# Patient Record
Sex: Male | Born: 1995 | Race: White | Hispanic: No | Marital: Single | Smoking: Never smoker
Health system: Southern US, Community
[De-identification: ages and names within clinical notes are randomized; demographics above are authoritative.]

## PROBLEM LIST (undated history)

## (undated) DIAGNOSIS — F840 Autistic disorder: Secondary | ICD-10-CM

## (undated) DIAGNOSIS — G935 Compression of brain: Secondary | ICD-10-CM

## (undated) DIAGNOSIS — R635 Abnormal weight gain: Secondary | ICD-10-CM

## (undated) DIAGNOSIS — I10 Essential (primary) hypertension: Secondary | ICD-10-CM

## (undated) DIAGNOSIS — R569 Unspecified convulsions: Secondary | ICD-10-CM

## (undated) HISTORY — PX: TONSILLECTOMY: SUR1361

## (undated) HISTORY — DX: Abnormal weight gain: R63.5

---
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
## 2013-09-14 DIAGNOSIS — G40919 Epilepsy, unspecified, intractable, without status epilepticus: Secondary | ICD-10-CM | POA: Insufficient documentation

## 2014-11-28 DIAGNOSIS — R001 Bradycardia, unspecified: Secondary | ICD-10-CM | POA: Insufficient documentation

## 2016-07-01 ENCOUNTER — Encounter (HOSPITAL_COMMUNITY): Payer: Self-pay

## 2016-07-01 ENCOUNTER — Emergency Department (HOSPITAL_COMMUNITY): Payer: Medicaid Other

## 2016-07-01 ENCOUNTER — Emergency Department (HOSPITAL_COMMUNITY)
Admission: EM | Admit: 2016-07-01 | Discharge: 2016-07-01 | Disposition: A | Discharge status: Home / Self Care | Payer: Medicaid Other | Attending: Emergency Medicine | Admitting: Emergency Medicine

## 2016-07-01 DIAGNOSIS — F840 Autistic disorder: Secondary | ICD-10-CM | POA: Diagnosis not present

## 2016-07-01 DIAGNOSIS — Y999 Unspecified external cause status: Secondary | ICD-10-CM | POA: Insufficient documentation

## 2016-07-01 DIAGNOSIS — Y939 Activity, unspecified: Secondary | ICD-10-CM | POA: Diagnosis not present

## 2016-07-01 DIAGNOSIS — S1091XA Abrasion of unspecified part of neck, initial encounter: Secondary | ICD-10-CM | POA: Insufficient documentation

## 2016-07-01 DIAGNOSIS — M79605 Pain in left leg: Secondary | ICD-10-CM

## 2016-07-01 DIAGNOSIS — Y92410 Unspecified street and highway as the place of occurrence of the external cause: Secondary | ICD-10-CM | POA: Diagnosis not present

## 2016-07-01 DIAGNOSIS — M79652 Pain in left thigh: Secondary | ICD-10-CM | POA: Insufficient documentation

## 2016-07-01 DIAGNOSIS — S199XXA Unspecified injury of neck, initial encounter: Secondary | ICD-10-CM | POA: Diagnosis present

## 2016-07-01 HISTORY — DX: Unspecified convulsions: R56.9

## 2016-07-01 HISTORY — DX: Compression of brain: G93.5

## 2016-07-01 HISTORY — DX: Autistic disorder: F84.0

## 2016-07-01 NOTE — Discharge Instructions (Signed)
Please use ibuprofen or naproxen as needed for discomfort. Please place ice and anywhere that is sore. If patient develops any new or worsening signs or symptoms please return for immediate evaluation. Please follow-up with her primary care provider in 3 days for reevaluation.
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
## 2016-07-01 NOTE — ED Notes (Signed)
Jeff PA at bedside.  

## 2016-07-01 NOTE — ED Notes (Signed)
Patient transported to X-ray 
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
## 2016-07-01 NOTE — ED Notes (Signed)
Pts grandmother stated that they did not have a scheduled CT scan for the pt, she stated "we were just going to come here and get one done".
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
## 2016-09-08 ENCOUNTER — Other Ambulatory Visit: Payer: Self-pay | Admitting: Internal Medicine

## 2016-09-08 DIAGNOSIS — R945 Abnormal results of liver function studies: Principal | ICD-10-CM

## 2016-09-08 DIAGNOSIS — R7989 Other specified abnormal findings of blood chemistry: Secondary | ICD-10-CM

## 2016-09-16 ENCOUNTER — Ambulatory Visit
Admission: RE | Admit: 2016-09-16 | Discharge: 2016-09-16 | Disposition: A | Discharge status: Home / Self Care | Payer: Medicaid Other | Source: Ambulatory Visit | Attending: Internal Medicine | Admitting: Internal Medicine

## 2016-09-16 DIAGNOSIS — R945 Abnormal results of liver function studies: Principal | ICD-10-CM

## 2016-09-16 DIAGNOSIS — R7989 Other specified abnormal findings of blood chemistry: Secondary | ICD-10-CM

## 2017-03-10 IMAGING — US US ABDOMEN LIMITED
1 series · 14 of 25 positions shown · non-contrast
Comparison: 06/11/2016 abdominal CT

CLINICAL DATA: Elevated LFTs.

EXAM:
US ABDOMEN LIMITED - RIGHT UPPER QUADRANT

[Series 1: us abdomen limited · 0.33mm/px · 14 of 38 slices shown]
[im 1/38]
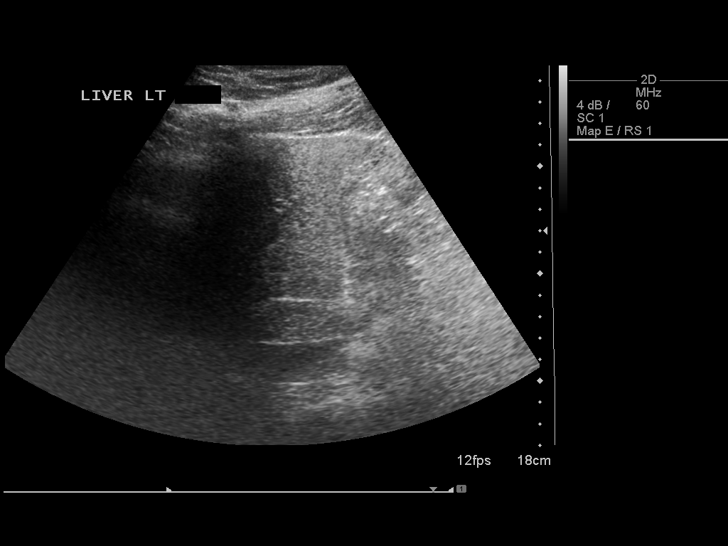
[im 4/38]
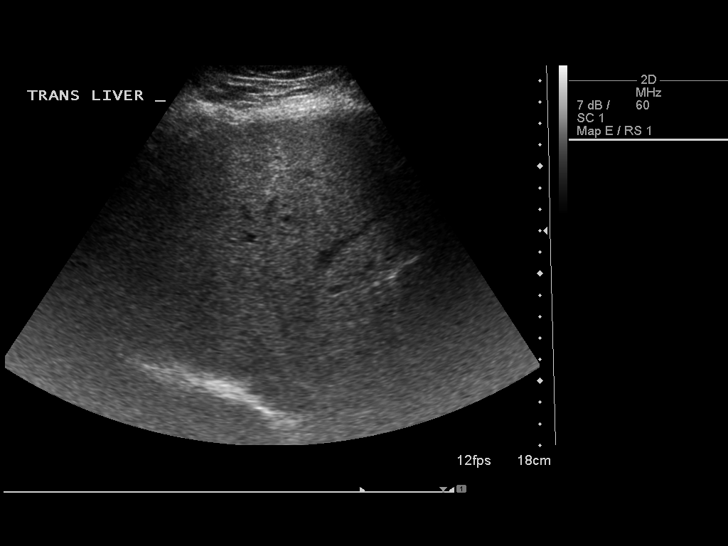
[im 7/38]
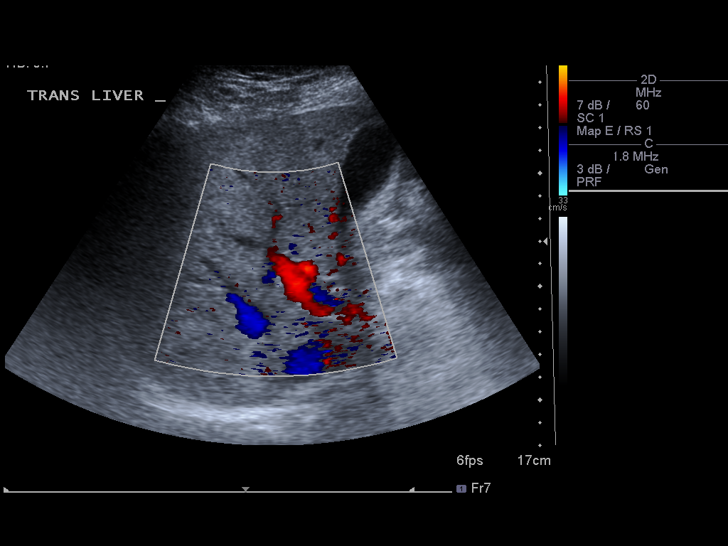
[im 10/38]
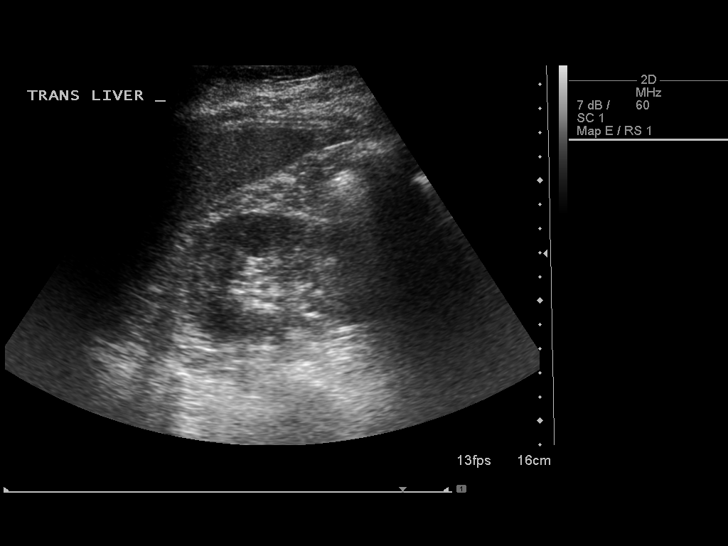
[im 13/38]
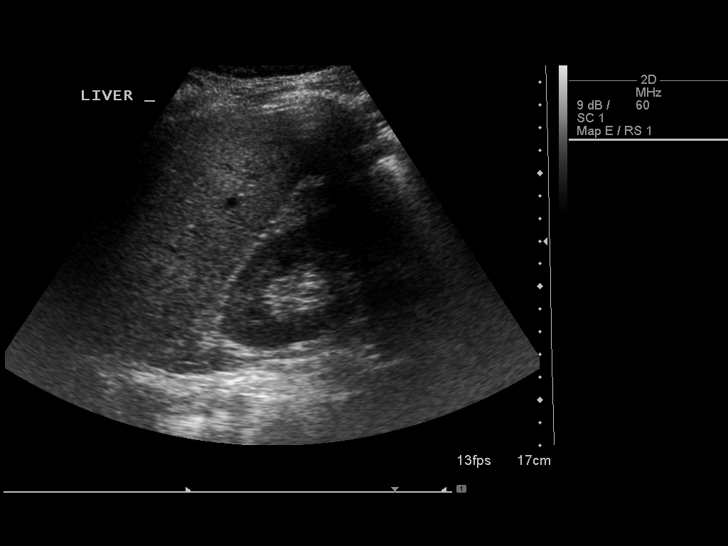
[im 14/38]
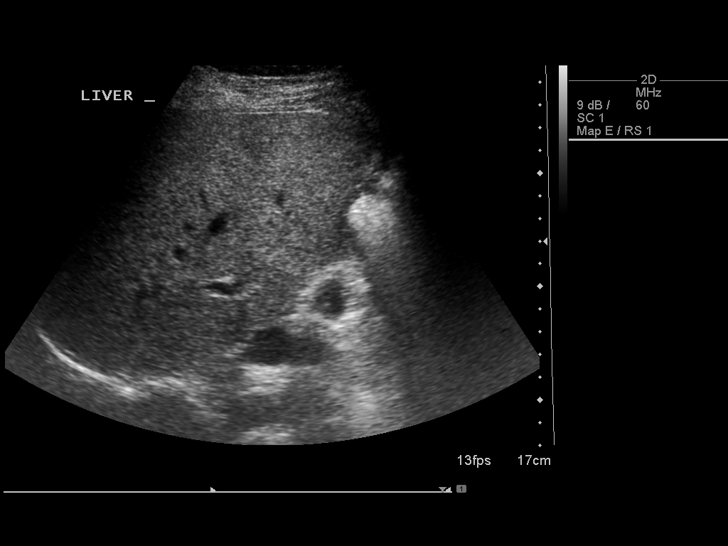
[im 17/38]
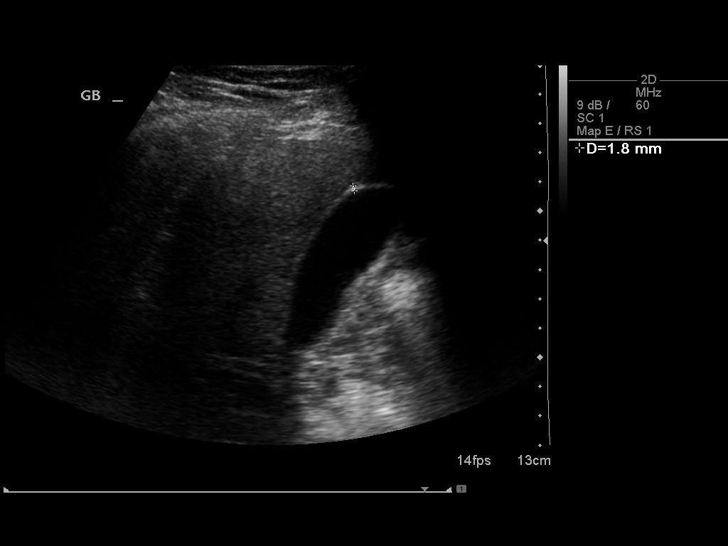
[im 21/38]
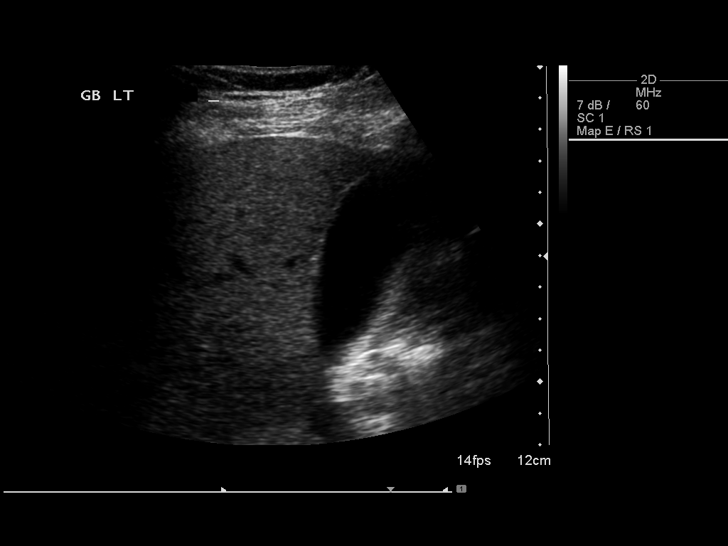
[im 24/38]
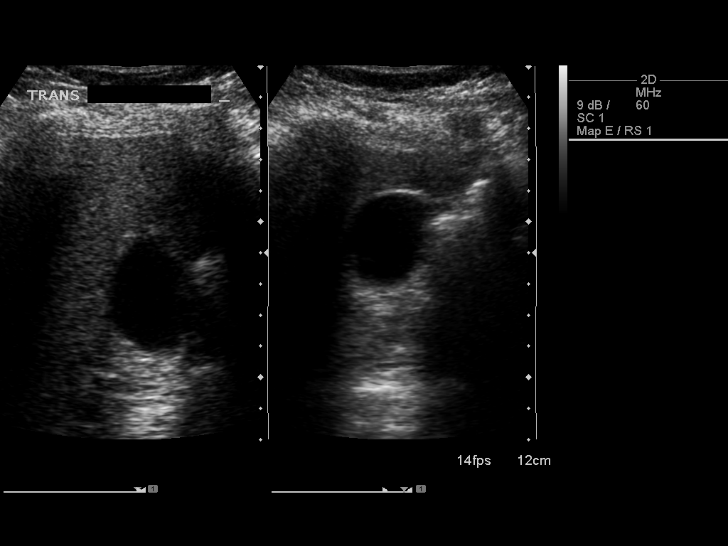
[im 25/38]
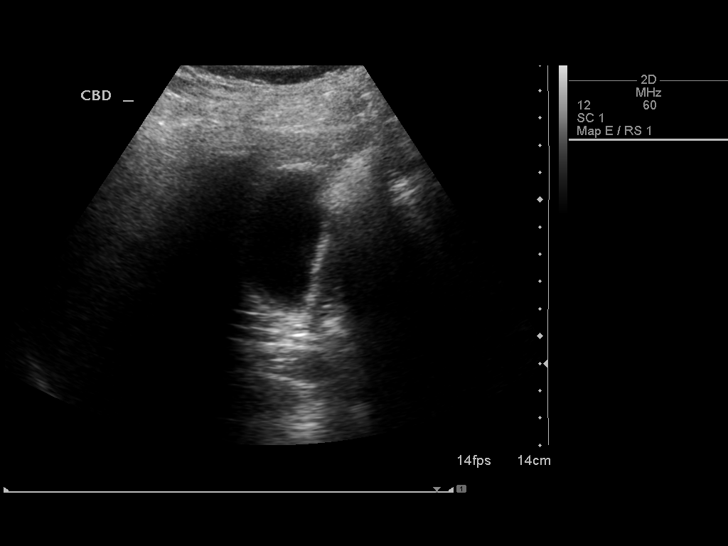
[im 28/38]
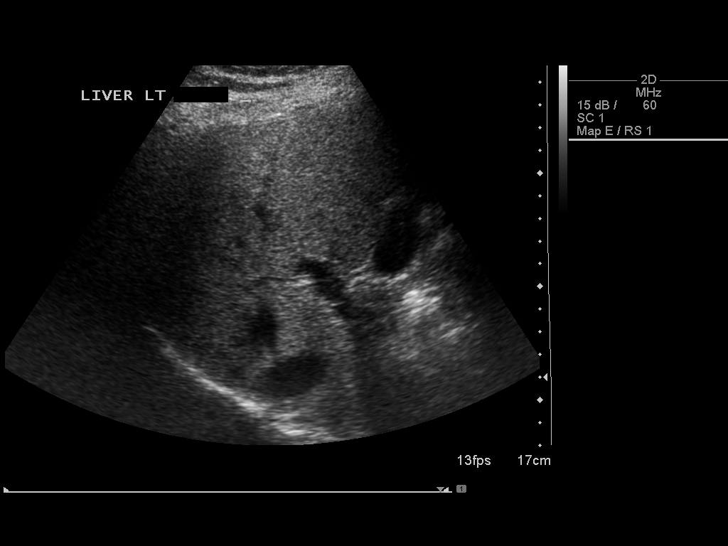
[im 31/38]
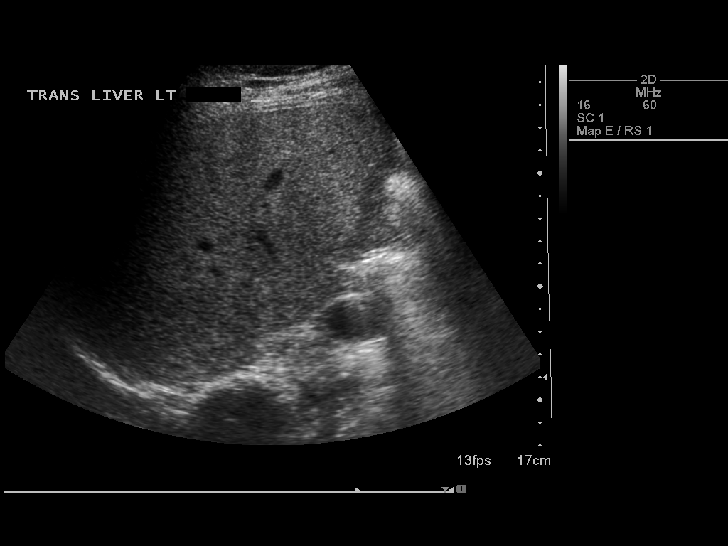
[im 34/38]
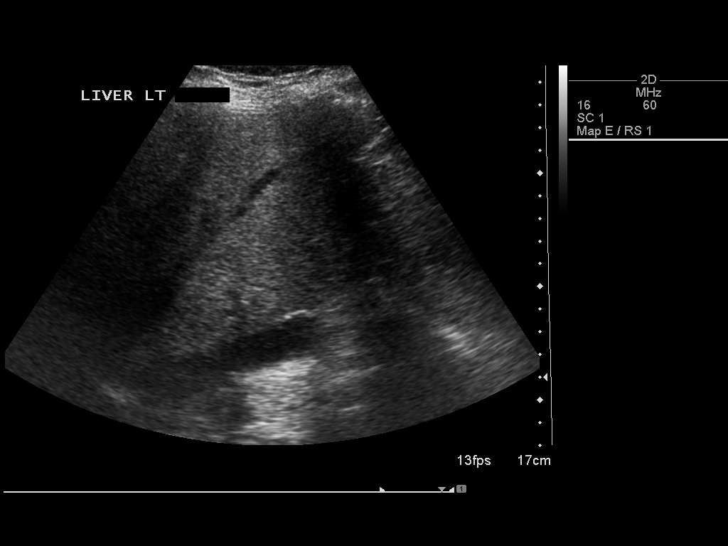
[im 38/38]
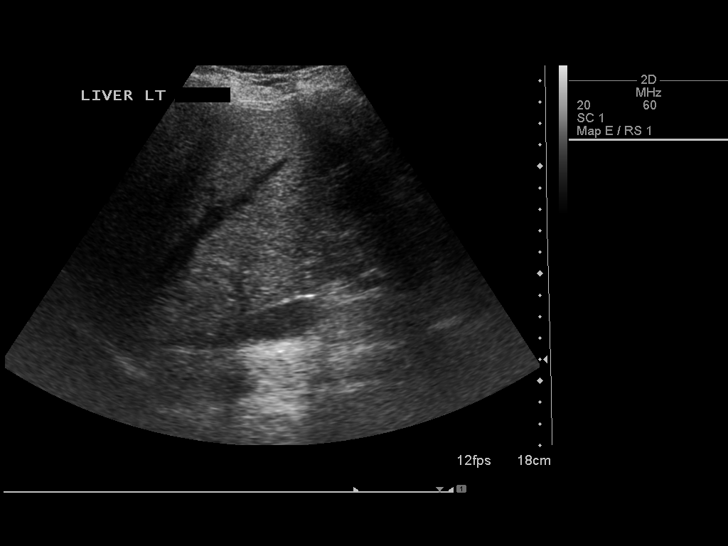

[14 of 25 positions shown; findings below may reference images not displayed]

FINDINGS: Gallbladder:

No gallstones or wall thickening visualized. No sonographic Murphy
sign noted by sonographer.

Common bile duct:

Diameter: 0.4 cm.

Liver:

Liver parenchyma is heterogeneous and there is slightly increased
echogenicity. No focal liver lesion.
IMPRESSION: Liver parenchyma demonstrates increased echogenicity and
heterogeneity. Findings are suggestive for hepatic steatosis.

## 2017-05-12 ENCOUNTER — Emergency Department (HOSPITAL_COMMUNITY)
Admission: EM | Admit: 2017-05-12 | Discharge: 2017-05-12 | Disposition: A | Discharge status: Home / Self Care | Payer: Medicaid Other | Attending: Emergency Medicine | Admitting: Emergency Medicine

## 2017-05-12 ENCOUNTER — Encounter (HOSPITAL_COMMUNITY): Payer: Self-pay | Admitting: Emergency Medicine

## 2017-05-12 DIAGNOSIS — R4689 Other symptoms and signs involving appearance and behavior: Secondary | ICD-10-CM

## 2017-05-12 DIAGNOSIS — R4589 Other symptoms and signs involving emotional state: Secondary | ICD-10-CM | POA: Diagnosis present

## 2017-05-12 DIAGNOSIS — F840 Autistic disorder: Secondary | ICD-10-CM | POA: Diagnosis not present

## 2017-05-12 DIAGNOSIS — Z79899 Other long term (current) drug therapy: Secondary | ICD-10-CM | POA: Diagnosis not present

## 2017-05-12 LAB — CBC
HCT: 48.3 % (ref 39.0–52.0)
Hemoglobin: 16.8 g/dL (ref 13.0–17.0)
MCH: 28.4 pg (ref 26.0–34.0)
MCHC: 34.8 g/dL (ref 30.0–36.0)
MCV: 81.6 fL (ref 78.0–100.0)
PLATELETS: 291 10*3/uL (ref 150–400)
RBC: 5.92 MIL/uL — ABNORMAL HIGH (ref 4.22–5.81)
RDW: 12.7 % (ref 11.5–15.5)
WBC: 13.1 10*3/uL — ABNORMAL HIGH (ref 4.0–10.5)

## 2017-05-12 LAB — COMPREHENSIVE METABOLIC PANEL
ALK PHOS: 59 U/L (ref 38–126)
ALT: 60 U/L (ref 17–63)
AST: 31 U/L (ref 15–41)
Albumin: 5.6 g/dL — ABNORMAL HIGH (ref 3.5–5.0)
Anion gap: 13 (ref 5–15)
BUN: 17 mg/dL (ref 6–20)
CALCIUM: 10.3 mg/dL (ref 8.9–10.3)
CHLORIDE: 106 mmol/L (ref 101–111)
CO2: 24 mmol/L (ref 22–32)
CREATININE: 0.97 mg/dL (ref 0.61–1.24)
GFR calc non Af Amer: >60 mL/min (ref >60)
GLUCOSE: 75 mg/dL (ref 65–99)
Potassium: 3.2 mmol/L — ABNORMAL LOW (ref 3.5–5.1)
SODIUM: 143 mmol/L (ref 135–145)
Total Bilirubin: 1.3 mg/dL — ABNORMAL HIGH (ref 0.3–1.2)
Total Protein: 8.8 g/dL — ABNORMAL HIGH (ref 6.5–8.1)

## 2017-05-12 LAB — URINALYSIS, ROUTINE W REFLEX MICROSCOPIC
BILIRUBIN URINE: NEGATIVE
GLUCOSE, UA: NEGATIVE mg/dL
HGB URINE DIPSTICK: NEGATIVE
KETONES UR: NEGATIVE mg/dL
LEUKOCYTES UA: NEGATIVE
Nitrite: NEGATIVE
PH: 7 (ref 5.0–8.0)
PROTEIN: NEGATIVE mg/dL
Specific Gravity, Urine: 1.018 (ref 1.005–1.030)

## 2017-05-12 LAB — RAPID URINE DRUG SCREEN, HOSP PERFORMED
Amphetamines: NOT DETECTED
BARBITURATES: NOT DETECTED
BENZODIAZEPINES: NOT DETECTED
Cocaine: NOT DETECTED
Opiates: NOT DETECTED
Tetrahydrocannabinol: NOT DETECTED

## 2017-05-12 LAB — ETHANOL

## 2017-05-12 MED ORDER — LEVETIRACETAM 500 MG PO TABS: 500.0000 mg | ORAL_TABLET | Freq: Two times a day (BID) | ORAL | Status: DC

## 2017-05-12 MED ORDER — VITAMIN D3 25 MCG (1000 UNIT) PO TABS
1000.0000 [IU] | ORAL_TABLET | Freq: Every day | ORAL | Status: DC
  Filled 2017-05-12: qty 1

## 2017-05-12 NOTE — ED Provider Notes (Signed)
[facility]-EMERGENCY DEPT Provider Note   CSN: [id number] Arrival date & time: 05/12/17  1844     History   Chief Complaint Chief Complaint  Patient presents with  . Aggressive Behavior    HPI Samuel Cisneros is a 21 y.o. male.  21 year old male with past medical history including seizure disorder, autism, Chiari malformation who presents with aggressive behavior. Caregiver reports that over the past several months the patient has had increasing aggressive behavior. Several months ago they tried a benzodiazepine with no effect and later switched him to hydroxyzine at night. He has been compliant with his medications but she reports that he has had increasing aggression at home, throwing things, hitting things, and kicking/punching. In the past she and the patient's father have been able to redirect him but recently this has not been working. Today he became aggressive and started hitting and kicking and she had difficulty calming him down. Recently she has been more concerned about his and her safety. No fevers, vomiting, or recent illness. Mother is currently out of town and pt is staying at caregiver's house. He normally lives back and forth between [employer] and caregiver's house.   The history is provided by a caregiver.    Past Medical History:  Diagnosis Date  . Autism   . Chiari malformation type I (HCC)   . Seizures (HCC)     There are no active problems to display for this patient.   Past Surgical History:  Procedure Laterality Date  . TONSILLECTOMY         Home Medications    Prior to Admission medications   Medication Sig Start Date End Date Taking? Authorizing Provider  cholecalciferol (VITAMIN D) 1000 units tablet Take 1,000 Units by mouth daily.   Yes [provider]  fenofibrate (TRICOR) 48 MG tablet Take 48 mg by mouth daily.   Yes [provider]  hydrochlorothiazide (HYDRODIURIL) 25 MG tablet Take 25 mg by mouth daily.   Yes [provider]  hydrOXYzine (VISTARIL) 25 MG capsule Take 25 mg by mouth at bedtime.   Yes [provider]  levETIRAcetam (KEPPRA) 500 MG tablet Take 500 mg by mouth 2 (two) times daily.   Yes [provider]    Family History No family history on file.  Social History Social History  Substance Use Topics  . Smoking status: Never Smoker  . Smokeless tobacco: Never Used  . Alcohol use No     Allergies   Patient has no known allergies.   Review of Systems Review of Systems  Unable to perform ROS: Psychiatric disorder     Physical Exam Updated Vital Signs BP 134/71 (BP Location: Left Arm)   Pulse 80   Temp 98 F (36.7 C) (Oral)   Resp 14   SpO2 98%   Physical Exam  Constitutional: He appears well-developed and well-nourished. No distress.  HENT:  Head: Normocephalic and atraumatic.  Mouth/Throat: Oropharynx is clear and moist.  Moist mucous membranes  Eyes: Conjunctivae are normal. Pupils are equal, round, and reactive to light.  Neck: Neck supple.  Cardiovascular: Normal rate, regular rhythm and normal heart sounds.   No murmur heard. Pulmonary/Chest: Effort normal and breath sounds normal.  Abdominal: Soft. Bowel sounds are normal. He exhibits no distension. There is no tenderness.  Musculoskeletal: He exhibits no edema.  Neurological: He is alert.  Follows commands  Skin: Skin is warm and dry.  Psychiatric:  Calm, cooperative  Nursing note and vitals reviewed.  ED Treatments / Results  Labs (all labs ordered are listed, but only abnormal results are displayed) Labs Reviewed  COMPREHENSIVE METABOLIC PANEL - Abnormal; Notable for the following:       Result Value   Potassium 3.2 (*)    Total Protein 8.8 (*)    Albumin 5.6 (*)    Total Bilirubin 1.3 (*)    All other components within normal limits  CBC - Abnormal; Notable for the following:    WBC 13.1 (*)    RBC 5.92 (*)    All other components within normal limits    URINALYSIS, ROUTINE W REFLEX MICROSCOPIC - Abnormal; Notable for the following:    APPearance HAZY (*)    All other components within normal limits  ETHANOL  RAPID URINE DRUG SCREEN, HOSP PERFORMED  LEVETIRACETAM LEVEL    EKG  EKG Interpretation None       Radiology No results found.  Procedures Procedures (including critical care time)  Medications Ordered in ED Medications  cholecalciferol (VITAMIN D) tablet 1,000 Units (not administered)  levETIRAcetam (KEPPRA) tablet 500 mg (not administered)     Initial Impression / Assessment and Plan / ED Course  I have reviewed the triage vital signs and the nursing notes.  Pertinent labs that were available during my care of the patient were reviewed by me and considered in my medical decision making (see chart for details).     PT w/ h/o autism p/w worsening aggressive behavior. On exam he was comfortable and cooperative with stable VS. No evidence of infection. Contacted TTS as the patient is 103kg and I suspect caregiver has difficulty maintaining safety during his outbursts.  Screening labs unremarkable.  TTS evaluated pt and staffed w/ Karleen HampshireSpencer, Psychiatry NP. They felt that the patient did not meet admission criteria. Social worker had further discussion with care provider and she states that she feels comfortable taking him home and at this point in time feels safe doing so. She understands reasons to return and will contact the patient's outpatient providers to schedule a close follow-up appointment to discuss his medications. Patient discharged in satisfactory condition. Final Clinical Impressions(s) / ED Diagnoses   Final diagnoses:  Aggressive behavior    New Prescriptions Discharge Medication List as of 05/12/2017  8:57 PM       Little, Ambrose Finlandachel Morgan, MD 05/12/17 2357

## 2017-05-12 NOTE — ED Notes (Signed)
Pt brought in by caregiver who stated "his mother is out of town for 2 weeks.  He was given narcotics a couple of weeks ago, ativan, but then they changed it to hydroxyzine.  Since the hydroxyzine, his behavior has gotten worse.  He has been hitting walls and himself.  He hasn't hit anyone."
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
## 2017-05-12 NOTE — ED Triage Notes (Signed)
Patient from caregiver's home today was exhibiting aggressive behavior such as hitting himself, hitting and kicking walls and doors.  Patient has history of autism and seizures.  Caregiver at bedside.

## 2017-05-12 NOTE — ED Notes (Signed)
Bed: WA27 Expected date:  Expected time:  Means of arrival:  Comments: [employer] psych pt 

## 2017-05-12 NOTE — ED Notes (Signed)
TTS assessment in progress. 

## 2017-05-14 LAB — LEVETIRACETAM LEVEL: Levetiracetam Lvl: 10.1 ug/mL (ref 10.0–40.0)

## 2017-11-25 ENCOUNTER — Encounter (HOSPITAL_COMMUNITY): Payer: Self-pay | Admitting: Emergency Medicine

## 2017-11-25 ENCOUNTER — Other Ambulatory Visit: Payer: Self-pay

## 2017-11-25 ENCOUNTER — Emergency Department (HOSPITAL_COMMUNITY): Payer: Medicaid Other

## 2017-11-25 DIAGNOSIS — Y939 Activity, unspecified: Secondary | ICD-10-CM | POA: Diagnosis not present

## 2017-11-25 DIAGNOSIS — W228XXA Striking against or struck by other objects, initial encounter: Secondary | ICD-10-CM | POA: Insufficient documentation

## 2017-11-25 DIAGNOSIS — Z79899 Other long term (current) drug therapy: Secondary | ICD-10-CM | POA: Insufficient documentation

## 2017-11-25 DIAGNOSIS — Y998 Other external cause status: Secondary | ICD-10-CM | POA: Diagnosis not present

## 2017-11-25 DIAGNOSIS — I10 Essential (primary) hypertension: Secondary | ICD-10-CM | POA: Diagnosis not present

## 2017-11-25 DIAGNOSIS — Y929 Unspecified place or not applicable: Secondary | ICD-10-CM | POA: Diagnosis not present

## 2017-11-25 DIAGNOSIS — F840 Autistic disorder: Secondary | ICD-10-CM | POA: Diagnosis not present

## 2017-11-25 DIAGNOSIS — S92414A Nondisplaced fracture of proximal phalanx of right great toe, initial encounter for closed fracture: Secondary | ICD-10-CM | POA: Insufficient documentation

## 2017-11-25 DIAGNOSIS — S99921A Unspecified injury of right foot, initial encounter: Secondary | ICD-10-CM | POA: Diagnosis present
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
## 2017-11-26 ENCOUNTER — Emergency Department (HOSPITAL_COMMUNITY)
Admission: EM | Admit: 2017-11-26 | Discharge: 2017-11-26 | Disposition: A | Discharge status: Home / Self Care | Payer: Medicaid Other | Attending: Emergency Medicine | Admitting: Emergency Medicine

## 2017-11-26 DIAGNOSIS — S92414A Nondisplaced fracture of proximal phalanx of right great toe, initial encounter for closed fracture: Secondary | ICD-10-CM

## 2017-11-26 HISTORY — DX: Essential (primary) hypertension: I10

## 2017-11-26 NOTE — Discharge Instructions (Signed)
Wear cam walker as applied.  Elevate your leg for the next 2 days.    Ice for 20 minutes every 2 hours while awake for the next 2 days.  Ibuprofen 600 mg every 6 hours as needed for pain.
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
## 2018-05-19 IMAGING — CR DG FOOT COMPLETE 3+V*R*
3 series · 3 of 3 positions shown · non-contrast
Comparison: None.

CLINICAL DATA: Swelling and bruising in the right first toe.

EXAM:
RIGHT FOOT COMPLETE - 3+ VIEW

[x foot ap right]
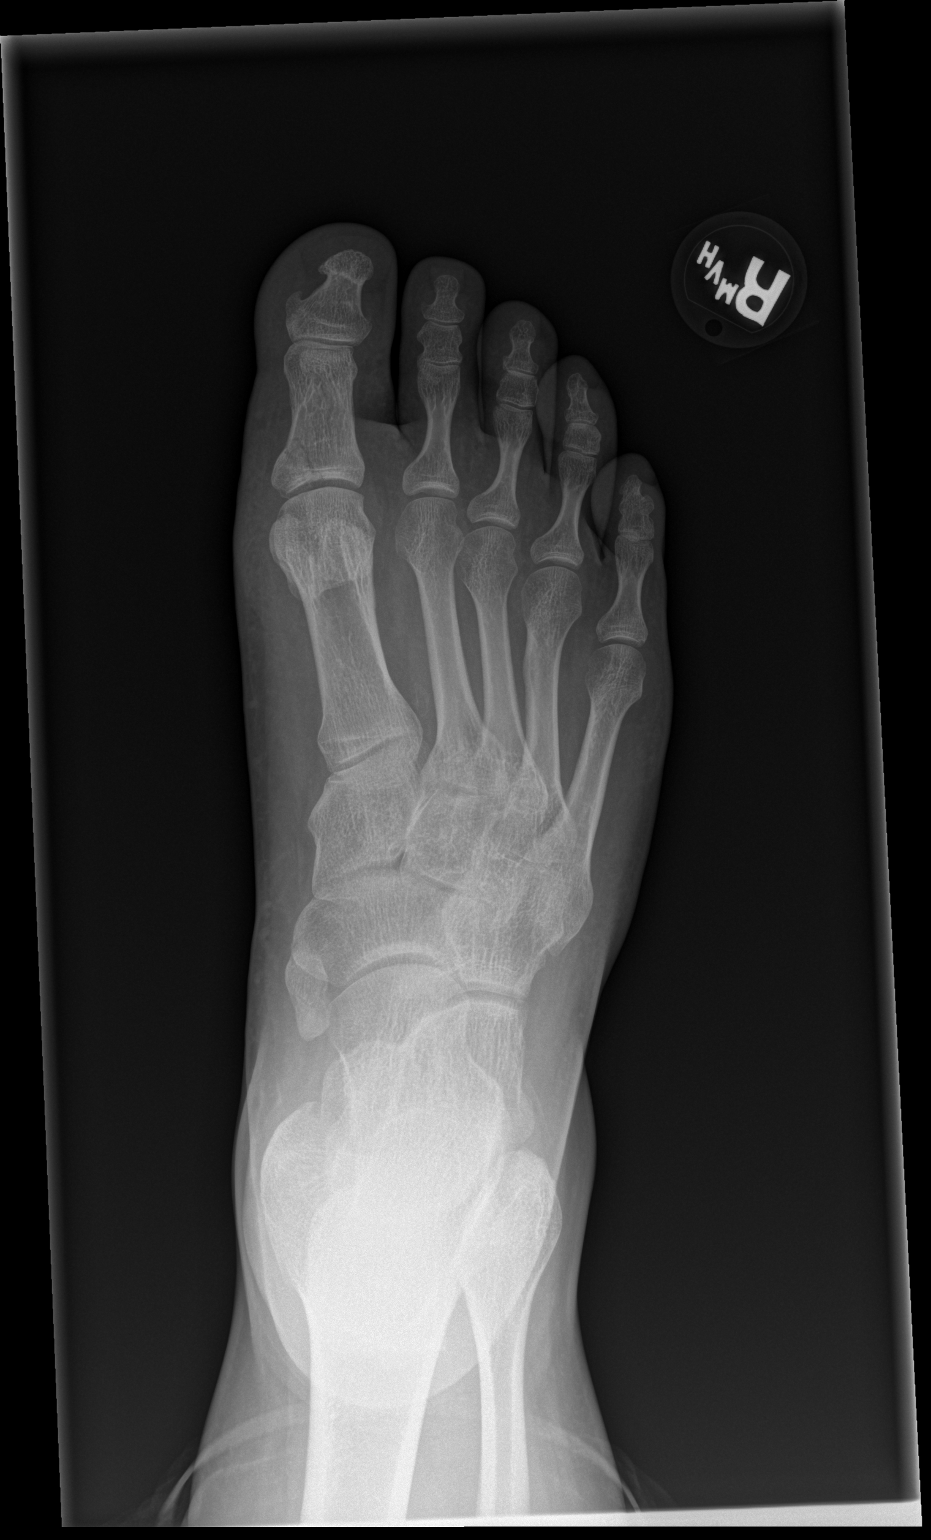

[x foot obl right]
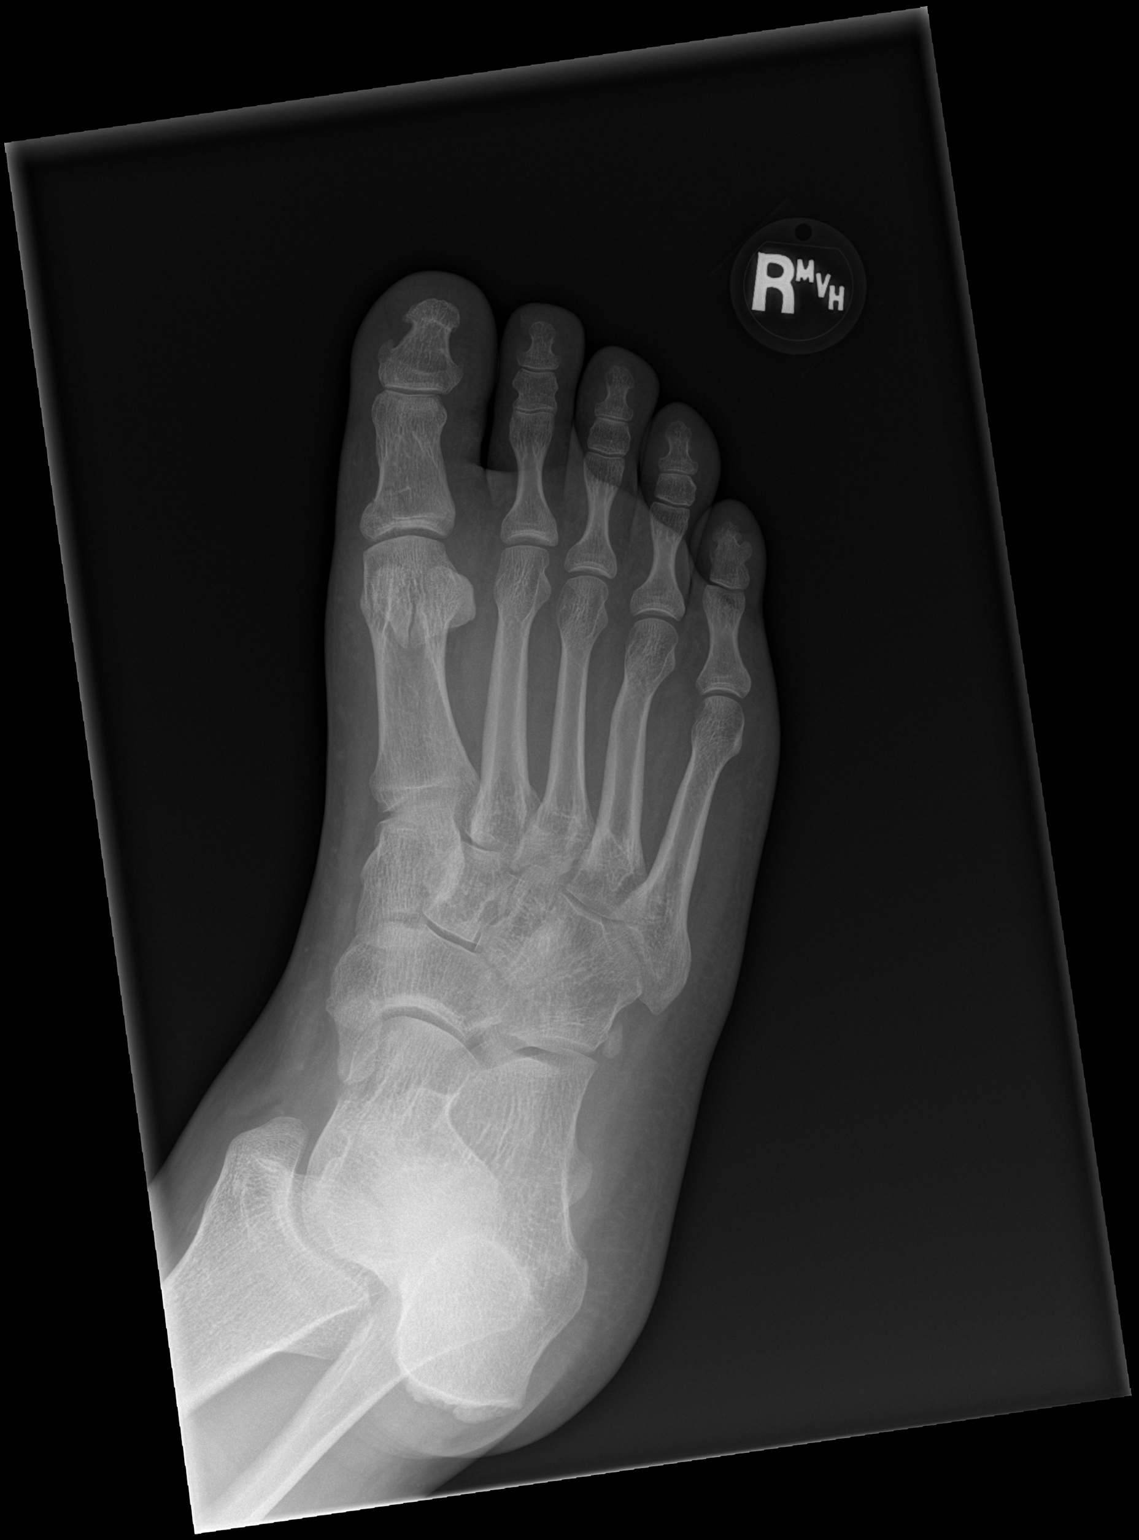

[x foot lat right]
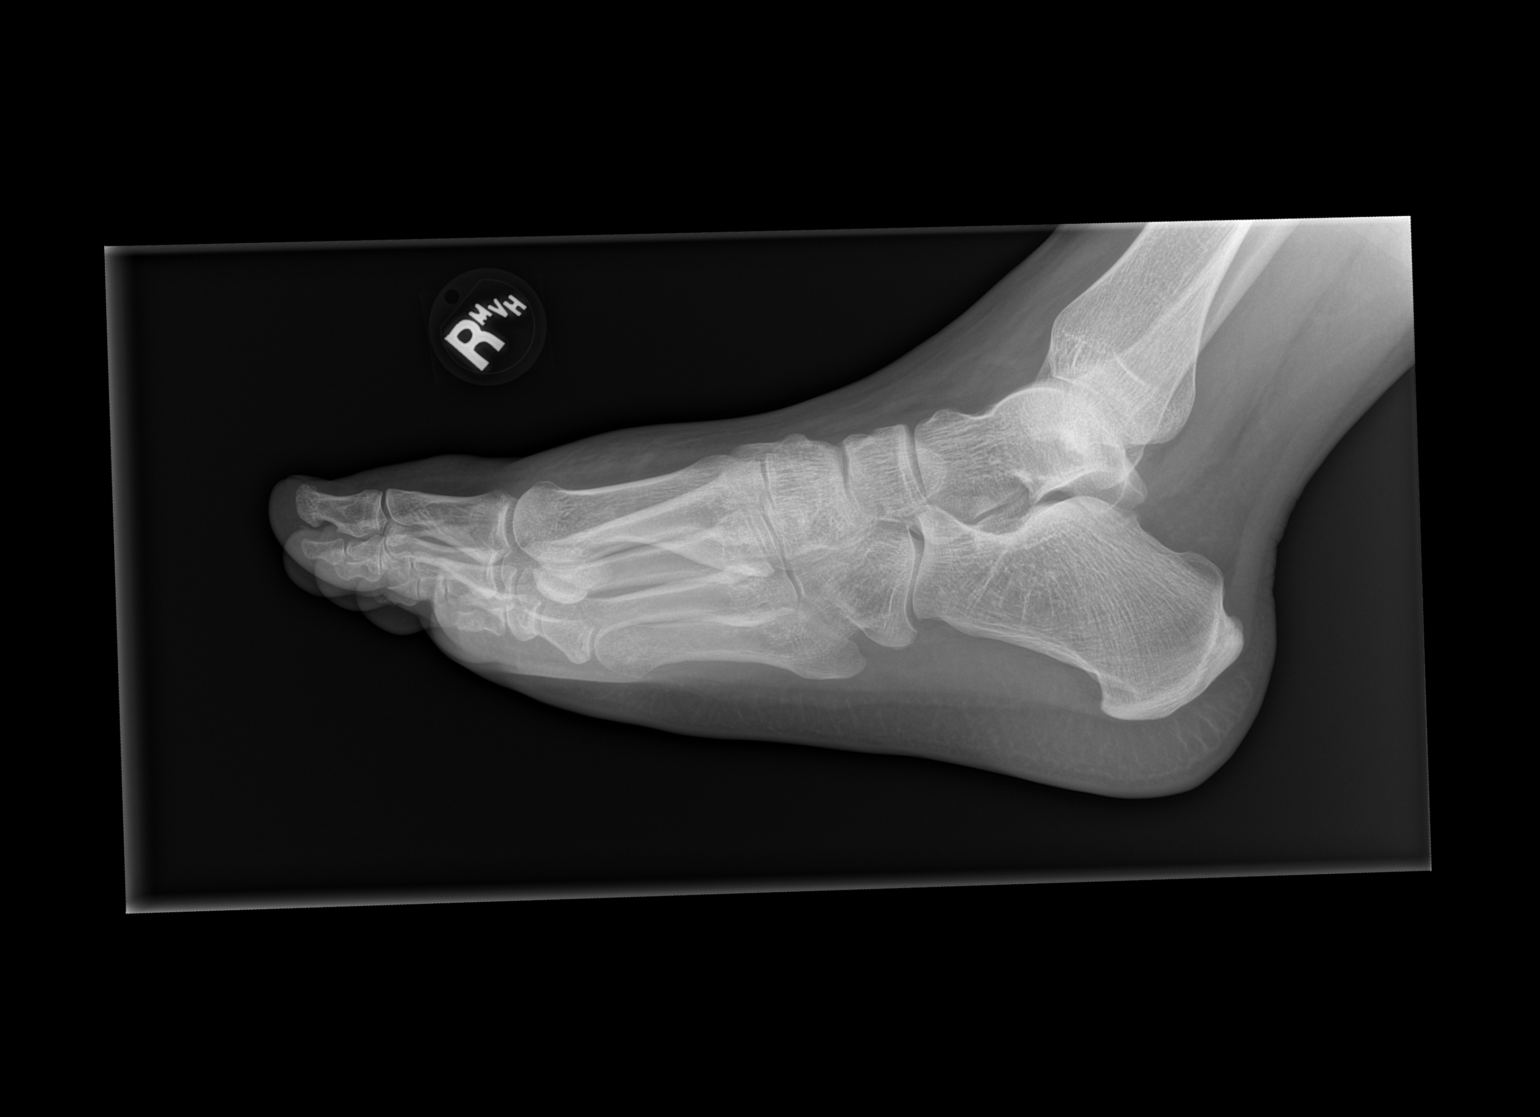

[3 of 3 positions shown; findings below may reference images not displayed]

FINDINGS: Intra-articular nondisplaced fracture of the medial base of the
proximal phalanx in the right first toe with surrounding soft tissue
swelling. No additional fracture. Apparent healed deformity in the
first metatarsal sesamoids. No dislocation. No suspicious focal
osseous lesion. No significant degenerative arthropathy. No
radiopaque foreign body.
IMPRESSION: Intra-articular nondisplaced base of proximal phalanx right first
toe fracture.

## 2023-01-13 ENCOUNTER — Institutional Professional Consult (permissible substitution): Payer: Medicaid Other | Admitting: Pulmonary Disease

## 2023-02-09 ENCOUNTER — Encounter: Payer: Self-pay | Admitting: Pulmonary Disease

## 2023-02-09 ENCOUNTER — Ambulatory Visit (INDEPENDENT_AMBULATORY_CARE_PROVIDER_SITE_OTHER): Payer: Medicaid Other | Admitting: Pulmonary Disease

## 2023-02-09 VITALS — BP 118/62 | HR 61 | Ht 68.0 in | Wt 265.0 lb

## 2023-02-09 DIAGNOSIS — R0683 Snoring: Secondary | ICD-10-CM | POA: Diagnosis not present

## 2023-02-09 DIAGNOSIS — G4719 Other hypersomnia: Secondary | ICD-10-CM | POA: Diagnosis not present

## 2023-02-09 NOTE — Addendum Note (Signed)
Addended by: June Leap on: 02/09/2023 11:52 AM   Modules accepted: Orders

## 2023-02-09 NOTE — Patient Instructions (Signed)
We will schedule you for a split-night study -Try and diagnose and treat the problem the same setting -We will look into the accommodations that the sleep lab can make so that somebody can stay with Samuel Cisneros  Tentative follow-up in about 3 months  Continue weight loss efforts  Call us with significant concerns

## 2023-02-09 NOTE — Progress Notes (Signed)
Samuel Cisneros    [id number]    [birth year]  [facility], [facility]  Referring Physician: Inc, [facility] [place] [address],  [place] [address]  Chief complaint:   Concern for sleep apnea  HPI:  Progressive weight gain Increased snoring Witnessed apneas Choking episodes at night, gasping respirations at night  Mom just diagnosed with OSA  Has been more tired during the day as well and sometimes takes naps the last about 2 hours Will feel better following the naps  Patient has autism  Has not been complaining of any chest pain or chest discomfort No complaints of headaches  Snoring noted to be louder recently with erratic breathing at night Usually goes to bed between 9 and 10 PM Takes about 3 to 5 minutes to fall asleep 1-2 awakenings Final wake up time between 630 and 7 AM  Does have a history of hypertension  Outpatient Encounter Medications as of 02/09/2023  Medication Sig   cholecalciferol (VITAMIN D) 1000 units tablet Take 1,000 Units by mouth daily.   fenofibrate (TRICOR) 48 MG tablet Take 48 mg by mouth daily.   hydrochlorothiazide (HYDRODIURIL) 25 MG tablet Take 25 mg by mouth daily.   hydrOXYzine (VISTARIL) 25 MG capsule Take 25 mg by mouth at bedtime.   levETIRAcetam (KEPPRA) 500 MG tablet Take 500 mg by mouth 2 (two) times daily.   No facility-administered encounter medications on file as of 02/09/2023.    Allergies as of 02/09/2023   (No Known Allergies)    Past Medical History:  Diagnosis Date   Autism    Chiari malformation type I ([place])    Hypertension    Seizures ([place])     Past Surgical History:  Procedure Laterality Date   TONSILLECTOMY      Family History  Problem Relation Age of Onset   Hypertension Paternal Grandfather     Social History   Socioeconomic History   Marital status: Single    Spouse name: Not on file   Number of children: Not on file    Years of education: Not on file   Highest education level: Not on file  Occupational History   Not on file  Tobacco Use   Smoking status: Never   Smokeless tobacco: Never  Substance and Sexual Activity   Alcohol use: No   Drug use: No   Sexual activity: Not on file  Other Topics Concern   Not on file  Social History Narrative   Not on file   Social Determinants of Health   Financial Resource Strain: Not on file  Food Insecurity: Not on file  Transportation Needs: Not on file  Physical Activity: Not on file  Stress: Not on file  Social Connections: Not on file  Intimate Partner Violence: Not on file    Review of Systems  Respiratory:  Positive for choking.   Psychiatric/Behavioral:  Positive for sleep disturbance.     There were no vitals filed for this visit.   Physical Exam Constitutional:      Appearance: He is obese.  HENT:     Head: Normocephalic.     Mouth/Throat:     Mouth: Mucous membranes are moist.  Eyes:     General: No scleral icterus. Cardiovascular:     Rate and Rhythm: Normal rate and regular rhythm.     Heart sounds: No murmur heard.    No friction rub.  Pulmonary:     Effort: No respiratory distress.     Breath sounds: No stridor. No wheezing or rhonchi.  Musculoskeletal:     Cervical back: No rigidity or tenderness.  Neurological:     Mental Status: He is alert.  Psychiatric:        Mood and Affect: Mood normal.       02/09/2023   11:00 AM  Results of the Epworth flowsheet  Sitting and reading 0  Watching TV 2  Sitting, inactive in a public place (e.g. a theatre or a meeting) 2  As a passenger in a car for an hour without a break 2  Lying down to rest in the afternoon when circumstances permit 2  Sitting and talking to someone 0  Sitting quietly after a lunch without alcohol 2  In a car, while stopped for a few minutes in traffic 0  Total score 10   Data Reviewed: No previous sleep study on record  Assessment:  Moderate to  high probability of significant obstructive sleep apnea  Class III obesity  History of autism  Daytime sleepiness  Pathophysiology of sleep disordered breathing discussed with patient's mother  Concern with significant hypoventilation with his BMI  Plan/Recommendations: Will schedule patient for an in lab split-night study  Patient will need some accommodation with respect to somebody staying with him to have a sleep study performed  Encouraged to continue weight loss efforts  Follow-up in about 3 months     Sherrilyn Rist MD [facility] 02/09/2023, 10:17 AM  CC: Inc, [facility]

## 2023-02-26 ENCOUNTER — Ambulatory Visit (HOSPITAL_BASED_OUTPATIENT_CLINIC_OR_DEPARTMENT_OTHER): Payer: Medicaid Other | Attending: Pulmonary Disease | Admitting: Pulmonary Disease

## 2023-02-26 DIAGNOSIS — R0683 Snoring: Secondary | ICD-10-CM | POA: Diagnosis present

## 2023-02-26 DIAGNOSIS — G4719 Other hypersomnia: Secondary | ICD-10-CM

## 2023-02-26 DIAGNOSIS — G4733 Obstructive sleep apnea (adult) (pediatric): Secondary | ICD-10-CM | POA: Insufficient documentation

## 2023-02-26 DIAGNOSIS — G4736 Sleep related hypoventilation in conditions classified elsewhere: Secondary | ICD-10-CM | POA: Diagnosis not present

## 2023-03-11 ENCOUNTER — Telehealth: Payer: Self-pay | Admitting: Pulmonary Disease

## 2023-03-11 DIAGNOSIS — R0683 Snoring: Secondary | ICD-10-CM

## 2023-03-11 NOTE — Procedures (Signed)
POLYSOMNOGRAPHY  Last, First: Samuel Cisneros, Samuel Cisneros MRN: [id number] Gender: Male Age (years): 27 Weight (lbs): 265 DOB: 1995/12/13 BMI: 42 Primary Care: No PCP Epworth Score: 10 Referring: Tomma Lightning MD Technician: Armen Pickup Interpreting: Tomma Lightning MD Study Type: NPSG Ordered Study Type: Split Night CPAP Study date: 02/26/2023 Location: [place] CLINICAL INFORMATION Tracy Kinner is a 27 year old Male and was referred to the sleep center for evaluation of N/A. Indications include N/A.  MEDICATIONS Patient self administered medications include: BUSPAR, HYDRODIURIL, KEPPRA, SEROQUEL. Medications administered during study include No sleep medicine administered.  SLEEP STUDY TECHNIQUE A multi-channel overnight Polysomnography study was performed. The channels recorded and monitored were central and occipital EEG, electrooculogram (EOG), submentalis EMG (chin), nasal and oral airflow, thoracic and abdominal wall motion, anterior tibialis EMG, snore microphone, electrocardiogram, and a pulse oximetry. TECHNICIAN COMMENTS Comments added by Technician: Pt went to restroom once. Patient had difficulty initiating sleep. Patient was restless all through the night. Comments added by Scorer: N/A SLEEP ARCHITECTURE The study was initiated at 9:27:47 PM and terminated at 5:11:52 AM. The total recorded time was 464.1 minutes. EEG confirmed total sleep time was 244 minutes yielding a sleep efficiency of 52.6%. Sleep onset after lights out was 48.3 minutes with a REM latency of 77.5 minutes. The patient spent 11.7% of the night in stage N1 sleep, 54.7% in stage N2 sleep, 4.5% in stage N3 and 29.1% in REM. Wake after sleep onset (WASO) was 171.8 minutes. The Arousal Index was 102.0/hour. RESPIRATORY PARAMETERS There were a total of 101 respiratory disturbances out of which 37 were apneas ( 23 obstructive, 0 mixed, 14 central) and 64 hypopneas. The apnea/hypopnea index (AHI) was 24.8 events/hour.  The central sleep apnea index was 3.4 events/hour. The REM AHI was 59.2 events/hour and NREM AHI was 10.8 events/hour. The supine AHI was 22.8 events/hour and the non supine AHI was 34.7 supine during 82.99% of sleep. Respiratory disturbances were associated with oxygen desaturation down to a nadir of 84.0% during sleep. The mean oxygen saturation during the study was 91.5%.   LEG MOVEMENT DATA The total leg movements were 1 with a resulting leg movement index of 0.2/hr .Associated arousal with leg movement index was 0.0/hr.  CARDIAC DATA The underlying cardiac rhythm was most consistent with sinus rhythm. Mean heart rate during sleep was 48.3 bpm. Additional rhythm abnormalities include None.  IMPRESSIONS - Moderate Obstructive Sleep apnea(OSA) - EKG showed no cardiac abnormalities. - Mild Oxygen Desaturation - Poor sleep efficiency - The patient snored with loud snoring volume. - No significant periodic leg movements(PLMs) during sleep. However, no significant associated arousals.  DIAGNOSIS - Obstructive Sleep Apnea (G47.33) - Nocturnal Hypoxemia (G47.36)  RECOMMENDATIONS - Therapeutic CPAP titration to determine optimal pressure required to alleviate sleep disordered breathing. - Auto CPAP 5-20 with heated humidification may be considered as an option of treatment, medium airfit F20 ResMed full face mask. - Avoid alcohol, sedatives and other CNS depressants that may worsen sleep apnea and disrupt normal sleep architecture. - Sleep hygiene should be reviewed to assess factors that may improve sleep quality. - Weight management and regular exercise should be initiated or continued.  [Electronically signed] 03/11/2023 06:06 AM  Virl Diamond MD NPI: [id number]

## 2023-03-11 NOTE — Telephone Encounter (Signed)
Call patient  Sleep study result  Date of study: 02/26/2020  Impression: Moderate obstructive sleep apnea Mild oxygen desaturations  Recommendation:  DME referral  Auto CPAP 5-20 with heated humidification may be considered as an option of treatment, medium airfit F20 ResMed full face mask.  Encourage weight loss measures  Follow-up in the office 4 to 6 weeks following initiation of treatment

## 2023-03-19 ENCOUNTER — Other Ambulatory Visit: Payer: Self-pay

## 2023-03-19 DIAGNOSIS — G4733 Obstructive sleep apnea (adult) (pediatric): Secondary | ICD-10-CM

## 2023-03-19 NOTE — Telephone Encounter (Signed)
Spoke with patient's Mother Tammy regarding son's sleep study  Sleep study result   Date of study: 02/26/2023   Impression: Moderate obstructive sleep apnea Mild oxygen desaturations   Recommendation:   DME referral   Auto CPAP 5-20 with heated humidification may be considered as an option of treatment, medium airfit F20 ResMed full face mask.   Encourage weight loss measures   Follow-up in the office 4 to 6 weeks following initiation of treatment    F/u was made for 05/17/2023  for CPAP f/u

## 2023-03-31 ENCOUNTER — Ambulatory Visit: Payer: Medicaid Other | Admitting: Pulmonary Disease

## 2023-04-22 ENCOUNTER — Encounter: Payer: Self-pay | Admitting: Pulmonary Disease

## 2023-05-17 ENCOUNTER — Ambulatory Visit: Payer: Medicaid Other | Admitting: Pulmonary Disease

## 2023-06-17 ENCOUNTER — Encounter: Payer: Self-pay | Admitting: Pulmonary Disease

## 2023-06-18 NOTE — Telephone Encounter (Signed)
We can just see him as needed, the appointment can be cancelled

## 2023-06-20 NOTE — Progress Notes (Deleted)
 @  Patient ID: Samuel Cisneros, male    DOB: [birth year], 27 y.o.   MRN: [id number]  No chief complaint on file.   Referring provider: Norm Salt, PA  HPI: 27 year old male. Never smoked. PMH significant for Seizure disorder, autistic disorder, bradycardia.  06/21/2023 Patient presents today for CPAP follow-up. Sleep study on 02/26/23 showed moderate OSA, mild oxygen desaturation. Started on auto CPAP 5-20cm h20.       Allergies  Allergen Reactions   Valproic Acid Other (See Comments)    Stomach issues. DRUG induced pancreatitis     There is no immunization history on file for this patient.  Past Medical History:  Diagnosis Date   Autism    Chiari malformation type I (HCC)    Hypertension    Seizures (HCC)    Weight gain     Tobacco History: Social History   Tobacco Use  Smoking Status Never  Smokeless Tobacco Never   Counseling given: Not Answered   Outpatient Medications Prior to Visit  Medication Sig Dispense Refill   busPIRone (BUSPAR) 10 MG tablet Take 10 mg by mouth 2 (two) times daily.     cholecalciferol (VITAMIN D) 1000 units tablet Take 1,000 Units by mouth daily.     diazePAM, 20 MG Dose, 2 x 10 MG/0.1ML LQPK Use 1 spray in 1 nostril as needed for seizures     fenofibrate (TRICOR) 48 MG tablet Take 48 mg by mouth daily.     hydrochlorothiazide (HYDRODIURIL) 25 MG tablet Take 25 mg by mouth daily.     hydrOXYzine (VISTARIL) 25 MG capsule Take 25 mg by mouth at bedtime.     levETIRAcetam (KEPPRA) 500 MG tablet Take 500 mg by mouth 2 (two) times daily.     QUEtiapine (SEROQUEL) 25 MG tablet TAKE 1 TAB TWICE A DAY ONE IN THE MORNING AND ONE IN THE AFTERNOON FOR AGITATION/AGGRESSIVE BEHAVIOR     No facility-administered medications prior to visit.      Review of Systems  Review of Systems   Physical Exam  There were no vitals taken for this visit. Physical Exam   Lab Results:  CBC    Component Value Date/Time   WBC 13.1 (H)  05/12/2017 1950   RBC 5.92 (H) 05/12/2017 1950   HGB 16.8 05/12/2017 1950   HCT 48.3 05/12/2017 1950   PLT 291 05/12/2017 1950   MCV 81.6 05/12/2017 1950   MCH 28.4 05/12/2017 1950   MCHC 34.8 05/12/2017 1950   RDW 12.7 05/12/2017 1950   LYMPHSABS 1.1 (L) 11/03/2007 0604   MONOABS 1.1 11/03/2007 0604   EOSABS 0.1 11/03/2007 0604   BASOSABS 0.4 (H) 11/03/2007 0604    BMET    Component Value Date/Time   NA 143 05/12/2017 1950   K 3.2 (L) 05/12/2017 1950   CL 106 05/12/2017 1950   CO2 24 05/12/2017 1950   GLUCOSE 75 05/12/2017 1950   BUN 17 05/12/2017 1950   CREATININE 0.97 05/12/2017 1950   CALCIUM 10.3 05/12/2017 1950   GFRNONAA >60 05/12/2017 1950   GFRAA >60 05/12/2017 1950    BNP No results found for: "BNP"  ProBNP No results found for: "PROBNP"  Imaging: No results found.   Assessment & Plan:   No problem-specific Assessment & Plan notes found for this encounter.     Glenford Bayley, NP 06/20/2023

## 2023-06-21 ENCOUNTER — Ambulatory Visit: Payer: Medicaid Other | Admitting: Primary Care

## 2023-09-08 NOTE — Progress Notes (Signed)
  Cardiology Office Note:  .   Date:  09/08/2023  ID:  Samuel Cisneros, DOB [birth year], MRN [id number] PCP: Norm Salt, PA  [facility] Providers Cardiologist:  None    History of Present Illness: .   Samuel Cisneros is a 27 y.o. male with hx of seizure disorder and autism who was referred to cardiology for bradycardia. He is taking anti-psychotic. His HR was 44 bpm.  He presents today with his mother for.  He is heart rates have been in the 40s.  She denies any symptoms from this, he has no history of syncope.  He has no family history of sudden cardiac death.  I walked him in clinic and his heart rate increased to the 70s.  His EKG shows sinus bradycardia.  ROS:  per HPI otherwise negative   Studies Reviewed: Marland Kitchen        EKG Interpretation Date/Time:  Friday September 10 2023 08:19:55 EDT Ventricular Rate:  49 PR Interval:  154 QRS Duration:  86 QT Interval:  452 QTC Calculation: 408 R Axis:   56  Text Interpretation: Sinus bradycardia When compared with ECG of 22-Jul-2009 10:36, PREVIOUS ECG IS PRESENT Confirmed by Carolan Clines ([id number]) on 09/10/2023 8:24:09 AM  Risk Assessment/Calculations:         Physical Exam:   VS:   Vitals:   09/10/23 0810  BP: (!) 102/54  Pulse: (!) 49    Wt Readings from Last 3 Encounters:  02/26/23 265 lb (120.2 kg)  02/09/23 265 lb (120.2 kg)  07/01/16 227 lb (103 kg)    GEN: Well nourished, well developed in no acute distress NECK: No JVD; No carotid bruits CARDIAC: RRR, no murmurs, rubs, gallops RESPIRATORY:  Clear to auscultation without rales, wheezing or rhonchi  ABDOMEN:  non-distended EXTREMITIES:  No edema; No deformity   ASSESSMENT AND PLAN: .   Bradycardia - he is asymptomatic; no syncope - HR augments with walking ; no signs of chronotropic incompetence. He can tolerate this HR. There is no indication for PPM. Would avoid AVN blocking agents        Dispo: Follow-up as needed  Signed, Ronna Herskowitz, Alben Spittle, MD

## 2023-09-10 ENCOUNTER — Ambulatory Visit: Payer: MEDICAID | Attending: Internal Medicine | Admitting: Internal Medicine

## 2023-09-10 ENCOUNTER — Encounter: Payer: Self-pay | Admitting: Internal Medicine

## 2023-09-10 VITALS — BP 102/54 | HR 49 | Ht 68.0 in | Wt 243.0 lb

## 2023-09-10 DIAGNOSIS — R001 Bradycardia, unspecified: Secondary | ICD-10-CM

## 2023-09-10 NOTE — Patient Instructions (Addendum)
I saw Samuel Cisneros in clinic for sinus bradycardia.  He has no symptoms from this as well as no history of syncope.  He has no family history of sudden cardiac death.  His heart rate increases with walking.  Therefore his sinus bradycardia is benign and he has no restrictions.  Would avoid any medications that lower the heart rate further.  He is safe to continue with his antipsychotic medication.  Otherwise, he does not require any intervention or further workup.  Medication Instructions:  No Change    Follow-Up: At [facility], you and your health needs are our priority.  As part of our continuing mission to provide you with exceptional heart care, we have created designated Provider Care Teams.  These Care Teams include your primary Cardiologist (physician) and Advanced Practice Providers (APPs -  Physician Assistants and Nurse Practitioners) who all work together to provide you with the care you need, when you need it.  Your next appointment:   Follow up as needed   Provider:   Dr. Carolan Clines
